# Patient Record
Sex: Male | Born: 1995
Health system: Southern US, Community
[De-identification: ages and names within clinical notes are randomized; demographics above are authoritative.]

## PROBLEM LIST (undated history)

## (undated) DIAGNOSIS — J45909 Unspecified asthma, uncomplicated: Secondary | ICD-10-CM

## (undated) HISTORY — PX: LYMPH NODE BIOPSY: SHX201

## (undated) HISTORY — PX: KNEE SURGERY: SHX244

---
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
## 2017-01-23 ENCOUNTER — Encounter (HOSPITAL_COMMUNITY): Payer: Self-pay

## 2017-01-23 ENCOUNTER — Emergency Department (HOSPITAL_COMMUNITY)
Admission: EM | Admit: 2017-01-23 | Discharge: 2017-01-23 | Disposition: A | Discharge status: Home / Self Care | Payer: BC Managed Care – PPO | Attending: Emergency Medicine | Admitting: Emergency Medicine

## 2017-01-23 DIAGNOSIS — J45909 Unspecified asthma, uncomplicated: Secondary | ICD-10-CM | POA: Diagnosis not present

## 2017-01-23 DIAGNOSIS — R55 Syncope and collapse: Secondary | ICD-10-CM | POA: Diagnosis present

## 2017-01-23 DIAGNOSIS — R001 Bradycardia, unspecified: Secondary | ICD-10-CM | POA: Insufficient documentation

## 2017-01-23 LAB — CBC WITH DIFFERENTIAL/PLATELET
BASOS ABS: 0 10*3/uL (ref 0.0–0.1)
BASOS PCT: 1 %
EOS PCT: 2 %
Eosinophils Absolute: 0.1 10*3/uL (ref 0.0–0.7)
HEMATOCRIT: 39.4 % (ref 39.0–52.0)
Hemoglobin: 13.2 g/dL (ref 13.0–17.0)
LYMPHS PCT: 24 %
Lymphs Abs: 1.3 10*3/uL (ref 0.7–4.0)
MCH: 28.9 pg (ref 26.0–34.0)
MCHC: 33.5 g/dL (ref 30.0–36.0)
MCV: 86.4 fL (ref 78.0–100.0)
MONO ABS: 0.4 10*3/uL (ref 0.1–1.0)
MONOS PCT: 8 %
NEUTROS ABS: 3.5 10*3/uL (ref 1.7–7.7)
Neutrophils Relative %: 65 %
PLATELETS: 243 10*3/uL (ref 150–400)
RBC: 4.56 MIL/uL (ref 4.22–5.81)
RDW: 13.6 % (ref 11.5–15.5)
WBC: 5.4 10*3/uL (ref 4.0–10.5)

## 2017-01-23 LAB — BASIC METABOLIC PANEL
Anion gap: 6 (ref 5–15)
BUN: 10 mg/dL (ref 6–20)
CALCIUM: 9.2 mg/dL (ref 8.9–10.3)
CO2: 27 mmol/L (ref 22–32)
Chloride: 105 mmol/L (ref 101–111)
Creatinine, Ser: 0.96 mg/dL (ref 0.61–1.24)
GFR calc Af Amer: >60 mL/min (ref >60)
GLUCOSE: 102 mg/dL — AB (ref 65–99)
Potassium: 4.2 mmol/L (ref 3.5–5.1)
Sodium: 138 mmol/L (ref 135–145)

## 2017-01-23 MED ORDER — AMMONIA AROMATIC IN INHA
RESPIRATORY_TRACT | Status: AC
  Filled 2017-01-23: qty 10

## 2017-01-23 NOTE — ED Notes (Signed)
Pt has a hematoma on his forehead from the fall

## 2017-01-23 NOTE — ED Provider Notes (Signed)
[facility]-EMERGENCY DEPT Provider Note: Johnny Dell, MD, FACEP  CSN: [id number] MRN: [id number] ARRIVAL: 01/23/17 at 0115 ROOM: WA20/[facility]20   CHIEF COMPLAINT  Syncope   HISTORY OF PRESENT ILLNESS  Johnny Keller is a 21 y.o. male. He was at the police station just prior to arrival having turn himself in for some outstanding warrants. He felt the need to move his bowels and as he was walking to the bathroom he had a syncopal episode. He bumped his left forehead on a wall and has a superficial abrasion there. He was brought to the ED where he is noted to be in sinus bradycardia, consistent with previous sinus bradycardia seen on EKG in 2015. He has had no further syncopal episodes and has been sleeping peacefully in the ED. He denies pain. He denies lightheadedness at the present time.   Past Medical History:  Diagnosis Date  . Asthma     Past Surgical History:  Procedure Laterality Date  . LYMPH NODE BIOPSY      History reviewed. No pertinent family history.  Social History  Substance Use Topics  . Smoking status: Never Smoker  . Smokeless tobacco: Never Used  . Alcohol use No    Prior to Admission medications   Not on File    Allergies Patient has no known allergies.   REVIEW OF SYSTEMS  Negative except as noted here or in the History of Present Illness.   PHYSICAL EXAMINATION  Initial Vital Signs Blood pressure (!) 103/55, pulse (!) 46, temperature (P) 97.6 F (36.4 C), temperature source (P) Oral, resp. rate 18, SpO2 98 %.  Examination General: Well-developed, well-nourished male in no acute distress; appearance consistent with age of record HENT: normocephalic; atraumatic Eyes: pupils equal, round and reactive to light; extraocular muscles intact Neck: supple Heart: regular rate and rhythm; sinus bradycardia on the monitor Lungs: clear to auscultation bilaterally Abdomen: soft; nondistended; nontender; bowel sounds present Extremities: No deformity; full  range of motion; pulses normal Neurologic: Awake, alert and oriented; motor function intact in all extremities and symmetric; no facial droop Skin: Warm and dry Psychiatric: Flat affect   RESULTS  Summary of this visit's results, reviewed by myself:   EKG Interpretation  Date/Time:  Wednesday Jan 23 2017 03:41:49 EDT Ventricular Rate:  44 PR Interval:    QRS Duration: 97 QT Interval:  435 QTC Calculation: 373 R Axis:   92 Text Interpretation:  Sinus bradycardia Borderline right axis deviation Abnormal Q suggests anterior infarct ST elev, probable normal early repol pattern No significant change was found Confirmed by Kyston Gonce  MD, Jonny Ruiz ([id number]) on 01/23/2017 4:48:44 AM      Laboratory Studies: Results for orders placed or performed during the hospital encounter of 01/23/17 (from the past 24 hour(s))  CBC with Differential/Platelet     Status: None   Collection Time: 01/23/17  3:25 AM  Result Value Ref Range   WBC 5.4 4.0 - 10.5 K/uL   RBC 4.56 4.22 - 5.81 MIL/uL   Hemoglobin 13.2 13.0 - 17.0 g/dL   HCT 62.1 30.8 - 65.7 %   MCV 86.4 78.0 - 100.0 fL   MCH 28.9 26.0 - 34.0 pg   MCHC 33.5 30.0 - 36.0 g/dL   RDW 84.6 96.2 - 95.2 %   Platelets 243 150 - 400 K/uL   Neutrophils Relative % 65 %   Neutro Abs 3.5 1.7 - 7.7 K/uL   Lymphocytes Relative 24 %   Lymphs Abs 1.3 0.7 - 4.0 K/uL  Monocytes Relative 8 %   Monocytes Absolute 0.4 0.1 - 1.0 K/uL   Eosinophils Relative 2 %   Eosinophils Absolute 0.1 0.0 - 0.7 K/uL   Basophils Relative 1 %   Basophils Absolute 0.0 0.0 - 0.1 K/uL  Basic metabolic panel     Status: Abnormal   Collection Time: 01/23/17  3:25 AM  Result Value Ref Range   Sodium 138 135 - 145 mmol/L   Potassium 4.2 3.5 - 5.1 mmol/L   Chloride 105 101 - 111 mmol/L   CO2 27 22 - 32 mmol/L   Glucose, Bld 102 (H) 65 - 99 mg/dL   BUN 10 6 - 20 mg/dL   Creatinine, Ser 0.27 0.61 - 1.24 mg/dL   Calcium 9.2 8.9 - 25.3 mg/dL   GFR calc non Af Amer >60 >60 mL/min   GFR  calc Af Amer >60 >60 mL/min   Anion gap 6 5 - 15   Imaging Studies: No results found.  ED COURSE  Nursing notes and initial vitals signs, including pulse oximetry, reviewed.  Vitals:   01/23/17 0315 01/23/17 0400 01/23/17 0500 01/23/17 0541  BP: (!) 103/55 (!) 96/45 (!) 98/55 110/65  Pulse: (!) 46 (!) 43 (!) 42 76  Resp: Temp:      TempSrc:      SpO2: 98% 100% 98% 100%   5:43 AM Most recent BP 110/65. Patient has ambulated without difficulty. His heart rate will drop into the high 40s when ambulating but he is not subjectively orthostatic. His heart rate has dropped low while sleeping but as noted on previous EKG from 2015 his heart rate was 43. I suspect his sinus bradycardia is a chronic issue. He admits to being athletic in his younger years.   PROCEDURES    ED DIAGNOSES     ICD-9-CM ICD-10-CM   1. Vasovagal syncope 780.2 R55   2. Chronic sinus bradycardia 427.81 R00.1        Paula Libra, MD 01/23/17 [phone]

## 2017-01-23 NOTE — ED Notes (Signed)
Pt was at the jail after turning himself in on some warrants for assault on a male and destruction of property Pt passed out while being processed and instructed to come to the ED

## 2017-01-23 NOTE — ED Triage Notes (Signed)
Pt states he has passed out before and was never told why he was

## 2017-01-23 NOTE — ED Notes (Signed)
Pt aware that a urine sample is needed but is unable to urinate at this time. 

## 2017-10-16 ENCOUNTER — Emergency Department (HOSPITAL_COMMUNITY)
Admission: EM | Admit: 2017-10-16 | Discharge: 2017-10-16 | Disposition: A | Discharge status: Home / Self Care | Payer: BC Managed Care – PPO | Attending: Emergency Medicine | Admitting: Emergency Medicine

## 2017-10-16 ENCOUNTER — Other Ambulatory Visit: Payer: Self-pay

## 2017-10-16 ENCOUNTER — Encounter (HOSPITAL_COMMUNITY): Payer: Self-pay | Admitting: Emergency Medicine

## 2017-10-16 DIAGNOSIS — Y999 Unspecified external cause status: Secondary | ICD-10-CM | POA: Insufficient documentation

## 2017-10-16 DIAGNOSIS — R55 Syncope and collapse: Secondary | ICD-10-CM | POA: Insufficient documentation

## 2017-10-16 DIAGNOSIS — Y939 Activity, unspecified: Secondary | ICD-10-CM | POA: Insufficient documentation

## 2017-10-16 DIAGNOSIS — Z23 Encounter for immunization: Secondary | ICD-10-CM | POA: Diagnosis not present

## 2017-10-16 DIAGNOSIS — S0181XA Laceration without foreign body of other part of head, initial encounter: Secondary | ICD-10-CM | POA: Diagnosis present

## 2017-10-16 DIAGNOSIS — W1830XA Fall on same level, unspecified, initial encounter: Secondary | ICD-10-CM | POA: Insufficient documentation

## 2017-10-16 DIAGNOSIS — Y929 Unspecified place or not applicable: Secondary | ICD-10-CM | POA: Diagnosis not present

## 2017-10-16 LAB — BASIC METABOLIC PANEL
ANION GAP: 8 (ref 5–15)
BUN: 5 mg/dL — ABNORMAL LOW (ref 6–20)
CALCIUM: 9.2 mg/dL (ref 8.9–10.3)
CO2: 24 mmol/L (ref 22–32)
Chloride: 108 mmol/L (ref 101–111)
Creatinine, Ser: 0.89 mg/dL (ref 0.61–1.24)
GFR calc Af Amer: >60 mL/min (ref >60)
Glucose, Bld: 104 mg/dL — ABNORMAL HIGH (ref 65–99)
POTASSIUM: 3.9 mmol/L (ref 3.5–5.1)
SODIUM: 140 mmol/L (ref 135–145)

## 2017-10-16 LAB — CBC
HEMATOCRIT: 40.4 % (ref 39.0–52.0)
HEMOGLOBIN: 13.5 g/dL (ref 13.0–17.0)
MCH: 28.5 pg (ref 26.0–34.0)
MCHC: 33.4 g/dL (ref 30.0–36.0)
MCV: 85.4 fL (ref 78.0–100.0)
Platelets: 187 10*3/uL (ref 150–400)
RBC: 4.73 MIL/uL (ref 4.22–5.81)
RDW: 13.5 % (ref 11.5–15.5)
WBC: 5.9 10*3/uL (ref 4.0–10.5)

## 2017-10-16 MED ORDER — TETANUS-DIPHTH-ACELL PERTUSSIS 5-2.5-18.5 LF-MCG/0.5 IM SUSP
0.5000 mL | Freq: Once | INTRAMUSCULAR | Status: AC
  Administered 2017-10-16: 0.5 mL via INTRAMUSCULAR
  Filled 2017-10-16: qty 0.5

## 2017-10-16 NOTE — ED Provider Notes (Signed)
[facility] EMERGENCY DEPARTMENT Provider Note   CSN: [id number] Arrival date & time: 10/16/17  1478     History   Chief Complaint Chief Complaint  Patient presents with  . Loss of Consciousness    HPI Johnny Keller is a 22 y.o. male.  The history is provided by the patient and medical records. No language interpreter was used.  Loss of Consciousness   Pertinent negatives include dizziness, headaches and weakness.   Johnny Keller is a 22 y.o. male  with a PMH of asthma who presents to the Emergency Department complaining of "needing stitches". He states that he passed out yesterday around 4pm and struck his chin on the ground. He will give very little history of what happened. He states that this has happened to him before and he was seen in ED and they told him everything was fine. He states that he didn't eat anything all day so he was dehydrated and just passed out. He states he is only here to get stiches and leave and doesn't want to talk about his syncopal episode any further. Unsure of tetanus status.   Past Medical History:  Diagnosis Date  . Asthma     There are no active problems to display for this patient.   Past Surgical History:  Procedure Laterality Date  . LYMPH NODE BIOPSY         Home Medications    Prior to Admission medications   Not on File    Family History History reviewed. No pertinent family history.  Social History Social History   Tobacco Use  . Smoking status: Never Smoker  . Smokeless tobacco: Never Used  Substance Use Topics  . Alcohol use: No  . Drug use: Yes    Types: Marijuana     Allergies   Patient has no known allergies.   Review of Systems Review of Systems  Cardiovascular: Positive for syncope.  Skin: Positive for wound.  Neurological: Positive for syncope. Negative for dizziness, weakness and headaches.  All other systems reviewed and are negative.    Physical Exam Updated Vital  Signs BP 114/69 (BP Location: Right Arm)   Pulse 63   Temp 98.2 F (36.8 C) (Oral)   Resp 18   SpO2 100%   Physical Exam  Constitutional: He is oriented to person, place, and time. He appears well-developed and well-nourished. No distress.  HENT:  Head: Normocephalic and atraumatic.  Cardiovascular: Normal rate, regular rhythm and normal heart sounds.  No murmur heard. Pulmonary/Chest: Effort normal and breath sounds normal. No respiratory distress.  Abdominal: Soft. He exhibits no distension. There is no tenderness.  Musculoskeletal: Normal range of motion.  Neurological: He is alert and oriented to person, place, and time.  Skin: Skin is warm and dry.  See image below of chin. 2.5 cm laceration.  Nursing note and vitals reviewed.      ED Treatments / Results  Labs (all labs ordered are listed, but only abnormal results are displayed) Labs Reviewed  BASIC METABOLIC PANEL - Abnormal; Notable for the following components:      Result Value   Glucose, Bld 104 (*)    BUN 5 (*)    All other components within normal limits  CBC    EKG  EKG Interpretation  Date/Time:  Wednesday October 16 2017 09:43:21 EST Ventricular Rate:  65 PR Interval:  152 QRS Duration: 90 QT Interval:  380 QTC Calculation: 395 R Axis:   88 Text Interpretation:  Normal sinus rhythm with sinus arrhythmia Right atrial enlargement Early repolarization Borderline ECG No significant change since last tracing Confirmed by Margarita Grizzleay, Danielle [phone]([id number]) on 10/16/2017 10:17:19 AM       Radiology No results found.  Procedures .Marland Kitchen.Laceration Repair Date/Time: 10/16/2017 10:34 AM Performed by: Spurgeon Gancarz, Chase PicketJaime Pilcher, PA-C Authorized by: Amaris Delafuente, Chase PicketJaime Pilcher, PA-C   Consent:    Consent obtained:  Verbal   Consent given by:  Patient   Risks discussed:  Poor cosmetic result and poor wound healing Anesthesia (see MAR for exact dosages):    Anesthesia method:  None Laceration details:    Location:  Face   Face  location:  Chin   Length (cm):  2 Repair type:    Repair type:  Simple Exploration:    Hemostasis achieved with:  Direct pressure Treatment:    Area cleansed with:  Hibiclens   Amount of cleaning:  Standard   Irrigation solution:  Sterile saline Skin repair:    Repair method:  Steri-Strips   Number of Steri-Strips:  1 Approximation:    Approximation:  Close   Vermilion border: well-aligned   Post-procedure details:    Dressing:  Open (no dressing)   (including critical care time)  Medications Ordered in ED Medications  Tdap (BOOSTRIX) injection 0.5 mL (0.5 mLs Intramuscular Given 10/16/17 1042)     Initial Impression / Assessment and Plan / ED Course  I have reviewed the triage vital signs and the nursing notes.  Pertinent labs & imaging results that were available during my care of the patient were reviewed by me and considered in my medical decision making (see chart for details).    Johnny Keller is a 22 y.o. male who presents to ED for evaluation of laceration to the chin which occurred at 4 PM yesterday. He reports that he "passed out" because he did not have anything to eat and was dehydrated. He refused cardiac monitoring and informed me that he had been seen for these episodes in the past and everything was fine. Patient refused cardiac monitoring. I discussed reason for cardiac monitoring for evaluation of syncope with NT in the room. Patient still refused. He states that he is not in the ER for syncopal episode and he is "only here to get my stitches and go home". Labs and EKG reassuring. He does have laceration to the chin. Unfortunately, this occurred ~ 18 hours ago. I feel the risk of suturing is greater than benefits. Wound thoroughly cleaned and irrigated in ED by me. Steri-strip applied and wound dressed. Signs of infection / reasons to return to ER discussed. Strongly encouraged patient to follow up with PCP for further discussion on syncope. All questions answered.    Patient discussed with Dr. Rosalia Hammersay who agrees with treatment plan.   Final Clinical Impressions(s) / ED Diagnoses   Final diagnoses:  Chin laceration, initial encounter    ED Discharge Orders    None       Bannon Giammarco, Chase PicketJaime Pilcher, PA-C 10/16/17 1044    Margarita Grizzleay, Danielle, MD 10/16/17 1712

## 2017-10-16 NOTE — ED Notes (Signed)
Pt refused to be on heart monitor. And refused to be in gown. Pt stated that his heart is fine and don't want to be on monitor.

## 2017-10-16 NOTE — ED Triage Notes (Signed)
Pt to for evaluation of syncopal episode yesterday. Abrasion to chin noted. VSS. Pt poor historian. Not providing much information.

## 2017-10-16 NOTE — Discharge Instructions (Signed)
Please keep wound clean and dry. Monitor for signs of infection including worsening redness, swelling or drainage from the site.  Return to ER for signs of infection, new or worsening symptoms, any additional concerns. I would also encourage you to follow up with your primary doctor for further discussion on your syncopal episodes (passing out).

## 2018-02-20 ENCOUNTER — Encounter (HOSPITAL_COMMUNITY): Payer: Self-pay | Admitting: Emergency Medicine

## 2018-02-20 ENCOUNTER — Ambulatory Visit (HOSPITAL_COMMUNITY)
Admission: EM | Admit: 2018-02-20 | Discharge: 2018-02-20 | Disposition: A | Discharge status: Home / Self Care | Payer: BC Managed Care – PPO | Attending: Family Medicine | Admitting: Family Medicine

## 2018-02-20 DIAGNOSIS — Z202 Contact with and (suspected) exposure to infections with a predominantly sexual mode of transmission: Secondary | ICD-10-CM | POA: Insufficient documentation

## 2018-02-20 DIAGNOSIS — Z113 Encounter for screening for infections with a predominantly sexual mode of transmission: Secondary | ICD-10-CM | POA: Diagnosis not present

## 2018-02-20 DIAGNOSIS — Z79899 Other long term (current) drug therapy: Secondary | ICD-10-CM | POA: Insufficient documentation

## 2018-02-20 MED ORDER — METRONIDAZOLE 500 MG PO TABS: 2000.0000 mg | ORAL_TABLET | Freq: Once | ORAL | 0 refills | Status: AC

## 2018-02-20 NOTE — Discharge Instructions (Signed)
You were treated empirically for trichomonas. Start flagyl as directed. Blood draw and cytology sent, you will be contacted with any positive results that requires further treatment. Refrain from sexual activity and alcohol use for the next 7 days. Monitor for any worsening of symptoms, fever, abdominal pain, nausea, vomiting, testicle pain/swelling, lesions/ulcer/sores on your penis, to follow up for reevaluation.

## 2018-02-20 NOTE — ED Notes (Signed)
Urine specimen obtained and in lab 

## 2018-02-20 NOTE — ED Triage Notes (Signed)
Pt states he was involved sexually with someone positive for trich. Pt needs std testing, denies symptoms.

## 2018-02-20 NOTE — ED Provider Notes (Signed)
[facility]    CSN: [id number] Arrival date & time: 02/20/18  1551     History   Chief Complaint Chief Complaint  Patient presents with  . Exposure to STD    HPI Johnny Keller is a 22 y.o. male.   22 year old male comes in for  STD testing.  States partner was tested positive for trichomonas.  Patient is asymptomatic.  Denies fever, chills, night sweats.  Denies abdominal pain, nausea, vomiting.  Denies urinary symptoms such as dysuria, frequency, hematuria.  Denies penile discharge, penile lesion, testicular pain, testicular swelling.  He is sexually active with one male partner, no condom use.     Past Medical History:  Diagnosis Date  . Asthma     There are no active problems to display for this patient.   Past Surgical History:  Procedure Laterality Date  . LYMPH NODE BIOPSY         Home Medications    Prior to Admission medications   Medication Sig Start Date End Date Taking? Authorizing Provider  QUEtiapine Fumarate (SEROQUEL PO) Take by mouth.   Yes [provider]  metroNIDAZOLE (FLAGYL) 500 MG tablet Take 4 tablets (2,000 mg total) by mouth once for 1 dose. 02/20/18 02/20/18  Belinda Fisher, PA-C    Family History No family history on file.  Social History Social History   Tobacco Use  . Smoking status: Never Smoker  . Smokeless tobacco: Never Used  Substance Use Topics  . Alcohol use: No  . Drug use: Yes    Types: Marijuana     Allergies   Patient has no known allergies.   Review of Systems Review of Systems  Reason unable to perform ROS: See HPI as above.     Physical Exam Triage Vital Signs ED Triage Vitals [02/20/18 1612]  Enc Vitals Group     BP (!) 155/54     Pulse Rate (!) 51     Resp 14     Temp 98.5 F (36.9 C)     Temp Source Oral     SpO2 100 %     Weight      Height      Head Circumference      Peak Flow      Pain Score      Pain Loc      Pain Edu?      Excl. in GC?    No data  found.  Updated Vital Signs BP (!) 155/54 (BP Location: Right Arm)   Pulse (!) 51   Temp 98.5 F (36.9 C) (Oral)   Resp 14   SpO2 100%   Visual Acuity Right Eye Distance:   Left Eye Distance:   Bilateral Distance:    Right Eye Near:   Left Eye Near:    Bilateral Near:     Physical Exam  Constitutional: He is oriented to person, place, and time. He appears well-developed and well-nourished. No distress.  HENT:  Head: Normocephalic and atraumatic.  Eyes: Pupils are equal, round, and reactive to light. Conjunctivae are normal.  Neurological: He is alert and oriented to person, place, and time.     UC Treatments / Results  Labs (all labs ordered are listed, but only abnormal results are displayed) Labs Reviewed  HIV ANTIBODY (ROUTINE TESTING)  URINE CYTOLOGY ANCILLARY ONLY    EKG None  Radiology No results found.  Procedures Procedures (including critical care time)  Medications Ordered in UC Medications - No data  to display  Initial Impression / Assessment and Plan / UC Course  I have reviewed the triage vital signs and the nursing notes.  Pertinent labs & imaging results that were available during my care of the patient were reviewed by me and considered in my medical decision making (see chart for details).    Patient was treated empirically for trich. Flagyl as directed. Cytology sent, patient will be contacted with any positive results that require additional treatment. Patient to refrain from sexual activity for the next 7 days. Return precautions given.   Final Clinical Impressions(s) / UC Diagnoses   Final diagnoses:  Exposure to STD    ED Prescriptions    Medication Sig Dispense Auth. Provider   metroNIDAZOLE (FLAGYL) 500 MG tablet Take 4 tablets (2,000 mg total) by mouth once for 1 dose. 4 tablet Threasa Alpha, [place] 02/20/18 1801

## 2018-02-21 ENCOUNTER — Telehealth (HOSPITAL_COMMUNITY): Payer: Self-pay

## 2018-02-21 LAB — URINE CYTOLOGY ANCILLARY ONLY
CHLAMYDIA, DNA PROBE: NEGATIVE
Neisseria Gonorrhea: NEGATIVE
Trichomonas: POSITIVE — AB

## 2018-02-21 LAB — HIV ANTIBODY (ROUTINE TESTING W REFLEX): HIV Screen 4th Generation wRfx: NONREACTIVE

## 2018-02-21 NOTE — Telephone Encounter (Signed)
Trichomonas is positive. Rx metronidazole was given at the urgent care visit. Need to educate patient to please refrain from sexual intercourse for 7 days to give the medicine time to work. Sexual partners need to be notified and tested/treated. Condoms may reduce risk of reinfection. Patient not available at this time. Encouraged family to have patient call back.

## 2018-03-24 ENCOUNTER — Telehealth (HOSPITAL_COMMUNITY): Payer: Self-pay

## 2018-03-24 NOTE — Telephone Encounter (Signed)
Pt returned call and is aware of test results.

## 2018-08-29 ENCOUNTER — Encounter (HOSPITAL_BASED_OUTPATIENT_CLINIC_OR_DEPARTMENT_OTHER): Payer: Self-pay | Admitting: Emergency Medicine

## 2018-08-29 ENCOUNTER — Other Ambulatory Visit: Payer: Self-pay

## 2018-08-29 ENCOUNTER — Emergency Department (HOSPITAL_BASED_OUTPATIENT_CLINIC_OR_DEPARTMENT_OTHER)
Admission: EM | Admit: 2018-08-29 | Discharge: 2018-08-29 | Disposition: A | Discharge status: Home / Self Care | Payer: BC Managed Care – PPO | Attending: Emergency Medicine | Admitting: Emergency Medicine

## 2018-08-29 ENCOUNTER — Emergency Department (HOSPITAL_BASED_OUTPATIENT_CLINIC_OR_DEPARTMENT_OTHER): Payer: BC Managed Care – PPO

## 2018-08-29 DIAGNOSIS — S62302A Unspecified fracture of third metacarpal bone, right hand, initial encounter for closed fracture: Secondary | ICD-10-CM | POA: Insufficient documentation

## 2018-08-29 DIAGNOSIS — J45909 Unspecified asthma, uncomplicated: Secondary | ICD-10-CM | POA: Insufficient documentation

## 2018-08-29 DIAGNOSIS — F17210 Nicotine dependence, cigarettes, uncomplicated: Secondary | ICD-10-CM | POA: Insufficient documentation

## 2018-08-29 DIAGNOSIS — W2209XA Striking against other stationary object, initial encounter: Secondary | ICD-10-CM | POA: Insufficient documentation

## 2018-08-29 DIAGNOSIS — Y929 Unspecified place or not applicable: Secondary | ICD-10-CM | POA: Insufficient documentation

## 2018-08-29 DIAGNOSIS — Y999 Unspecified external cause status: Secondary | ICD-10-CM | POA: Insufficient documentation

## 2018-08-29 DIAGNOSIS — Y939 Activity, unspecified: Secondary | ICD-10-CM | POA: Diagnosis not present

## 2018-08-29 DIAGNOSIS — S6991XA Unspecified injury of right wrist, hand and finger(s), initial encounter: Secondary | ICD-10-CM | POA: Diagnosis present

## 2018-08-29 MED ORDER — TRAMADOL HCL 50 MG PO TABS: 50.0000 mg | ORAL_TABLET | Freq: Four times a day (QID) | ORAL | 0 refills | Status: DC | PRN

## 2018-08-29 MED FILL — traMADol HCL 50 MG TABS: 50 | 3 days supply | Qty: 15 | Fill #0

## 2018-08-29 NOTE — ED Provider Notes (Signed)
[facility] EMERGENCY DEPARTMENT Provider Note   CSN: [id number] Arrival date & time: 08/29/18  [id number]     History   Chief Complaint Chief Complaint  Patient presents with  . Hand Injury    HPI Johnny Keller is a 22 y.o. male.  Patient is a 22 year old male with no significant past medical history.  He presents with complaints of right hand pain.  He punched a wall last night while he was angry.  He has swelling over the knuckles and pain with movement and range of motion.  The history is provided by the patient.  Hand Injury   The incident occurred yesterday. The incident occurred at home. Injury mechanism: Punched a wall. The pain is present in the right hand. The pain is moderate. The pain has been constant since the incident. The symptoms are aggravated by movement and palpation.    Past Medical History:  Diagnosis Date  . Asthma     There are no active problems to display for this patient.   Past Surgical History:  Procedure Laterality Date  . LYMPH NODE BIOPSY          Home Medications    Prior to Admission medications   Medication Sig Start Date End Date Taking? Authorizing Provider  QUEtiapine Fumarate (SEROQUEL PO) Take by mouth.    [provider]    Family History History reviewed. No pertinent family history.  Social History Social History   Tobacco Use  . Smoking status: Current Every Day Smoker    Packs/day: 1.00    Types: Cigarettes  . Smokeless tobacco: Never Used  Substance Use Topics  . Alcohol use: No  . Drug use: Yes    Types: Marijuana     Allergies   Patient has no known allergies.   Review of Systems Review of Systems  All other systems reviewed and are negative.    Physical Exam Updated Vital Signs BP (!) 105/99 (BP Location: Left Arm)   Pulse 62   Temp 98.3 F (36.8 C) (Oral)   Resp 16   Ht 5\' 11"  (1.803 m)   Wt 63.5 kg   SpO2 98%   BMI 19.53 kg/m   Physical Exam  Constitutional: He  is oriented to person, place, and time. He appears well-developed and well-nourished. No distress.  HENT:  Head: Normocephalic and atraumatic.  Neck: Normal range of motion. Neck supple.  Pulmonary/Chest: Effort normal.  Musculoskeletal:  There is swelling and tenderness over the distal third carpal.  He has pain with range of motion.  Motor and sensation are intact all fingers and capillary refill is brisk.  Neurological: He is alert and oriented to person, place, and time.  Skin: He is not diaphoretic.  Nursing note and vitals reviewed.    ED Treatments / Results  Labs (all labs ordered are listed, but only abnormal results are displayed) Labs Reviewed - No data to display  EKG None  Radiology No results found.  Procedures Procedures (including critical care time)  Medications Ordered in ED Medications - No data to display   Initial Impression / Assessment and Plan / ED Course  I have reviewed the triage vital signs and the nursing notes.  Pertinent labs & imaging results that were available during my care of the patient were reviewed by me and considered in my medical decision making (see chart for details).  X-rays show a fracture of the distal third metacarpal.  It is nondisplaced.  He will be placed  in a splint and advised to follow-up with hand surgery in the next week.  Final Clinical Impressions(s) / ED Diagnoses   Final diagnoses:  None    ED Discharge Orders    None       Geoffery Lyons, MD 08/29/18 [phone]

## 2018-08-29 NOTE — Discharge Instructions (Addendum)
Ibuprofen 600 mg every 6 hours as needed for pain.  Tramadol as prescribed as needed for pain not relieved with ibuprofen.  Wear splint until followed up by orthopedics.  You are to schedule an appointment with the hand surgeon within the next week.  The contact information for Dr. Bing ReeUzma has been provided in this discharge summary for you to call and make these arrangements.

## 2018-08-29 NOTE — ED Triage Notes (Signed)
Reports punched a wall with right hand.  C/o pain to entire right hand.

## 2018-12-24 ENCOUNTER — Emergency Department (HOSPITAL_COMMUNITY): Payer: BC Managed Care – PPO

## 2018-12-24 ENCOUNTER — Encounter (HOSPITAL_COMMUNITY): Payer: Self-pay

## 2018-12-24 ENCOUNTER — Emergency Department (HOSPITAL_COMMUNITY)
Admission: EM | Admit: 2018-12-24 | Discharge: 2018-12-24 | Disposition: A | Discharge status: Home / Self Care | Payer: BC Managed Care – PPO | Attending: Emergency Medicine | Admitting: Emergency Medicine

## 2018-12-24 ENCOUNTER — Other Ambulatory Visit: Payer: Self-pay

## 2018-12-24 DIAGNOSIS — X509XXA Other and unspecified overexertion or strenuous movements or postures, initial encounter: Secondary | ICD-10-CM | POA: Diagnosis not present

## 2018-12-24 DIAGNOSIS — S83005A Unspecified dislocation of left patella, initial encounter: Secondary | ICD-10-CM

## 2018-12-24 DIAGNOSIS — Y939 Activity, unspecified: Secondary | ICD-10-CM | POA: Insufficient documentation

## 2018-12-24 DIAGNOSIS — Y999 Unspecified external cause status: Secondary | ICD-10-CM | POA: Diagnosis not present

## 2018-12-24 DIAGNOSIS — Y929 Unspecified place or not applicable: Secondary | ICD-10-CM | POA: Diagnosis not present

## 2018-12-24 DIAGNOSIS — F17210 Nicotine dependence, cigarettes, uncomplicated: Secondary | ICD-10-CM | POA: Diagnosis not present

## 2018-12-24 DIAGNOSIS — J45909 Unspecified asthma, uncomplicated: Secondary | ICD-10-CM | POA: Insufficient documentation

## 2018-12-24 DIAGNOSIS — S80912A Unspecified superficial injury of left knee, initial encounter: Secondary | ICD-10-CM | POA: Diagnosis present

## 2018-12-24 MED ORDER — ETOMIDATE 2 MG/ML IV SOLN
INTRAVENOUS | Status: AC | PRN
  Administered 2018-12-24: 10 mg via INTRAVENOUS

## 2018-12-24 MED ORDER — ETOMIDATE 2 MG/ML IV SOLN
0.1500 mg/kg | Freq: Once | INTRAVENOUS | Status: AC
  Administered 2018-12-24: 23:00:00 9.52 mg via INTRAVENOUS
  Filled 2018-12-24: qty 10

## 2018-12-24 MED ORDER — KETOROLAC TROMETHAMINE 30 MG/ML IJ SOLN
30.0000 mg | Freq: Once | INTRAMUSCULAR | Status: AC
  Administered 2018-12-24: 30 mg via INTRAVENOUS
  Filled 2018-12-24: qty 1

## 2018-12-24 MED ORDER — HYDROMORPHONE HCL 1 MG/ML IJ SOLN
1.0000 mg | Freq: Once | INTRAMUSCULAR | Status: AC
  Administered 2018-12-24: 1 mg via INTRAVENOUS
  Filled 2018-12-24: qty 1

## 2018-12-24 MED ORDER — LORAZEPAM 2 MG/ML IJ SOLN
1.0000 mg | Freq: Once | INTRAMUSCULAR | Status: AC
  Administered 2018-12-24: 1 mg via INTRAVENOUS
  Filled 2018-12-24: qty 1

## 2018-12-24 MED ORDER — ONDANSETRON HCL 4 MG/2ML IJ SOLN
4.0000 mg | Freq: Once | INTRAMUSCULAR | Status: AC
  Administered 2018-12-24: 4 mg via INTRAVENOUS
  Filled 2018-12-24: qty 2

## 2018-12-24 NOTE — ED Notes (Signed)
Patient transported to X-ray 

## 2018-12-24 NOTE — ED Notes (Addendum)
Pts sweatpants and long sleeve shirt cut off, per pt request.  Pt still in undershirt and basketball shorts.  Wallet, 2 cell phones, loose cash and change from sweatpants all placed in pt belonging bag and left at bedside.

## 2018-12-24 NOTE — ED Notes (Signed)
Pt not allowing this RN to assist with clothing removal at this time, stating that he is in too much pain.

## 2018-12-24 NOTE — ED Notes (Signed)
Pt refuses wheelchair and will use his crutches to leave

## 2018-12-24 NOTE — ED Triage Notes (Signed)
Pt BIBA from home.  Pt was reaching into car when "knee started hurting, was able to lower himself to the ground."  Knee is in pillow splint on arrival.    EMS gave total of 150 mcg Fentanyl, last dose at 1648.

## 2018-12-24 NOTE — ED Provider Notes (Signed)
[facility]-EMERGENCY DEPT Provider Note   CSN: [id number] Arrival date & time: 12/24/18  1654    History   Chief Complaint Chief Complaint  Patient presents with  . Knee Injury    left    HPI Johnny Keller is a 23 y.o. male.     HPI   Patient reports twisting his knee and his left knee "came out of socket."  He was walking to his car carrying something when it happened.  He states his left patella is "always on the side."  Last problem with it was 2 years ago.  He is never had surgery for it.  He resents by EMS for evaluation.  He was treated with analgesia during transport.  He is still in severe pain and has not been able to be dressed by nursing, secondary to his pain.  There are no other known modifying factors.  Past Medical History:  Diagnosis Date  . Asthma     There are no active problems to display for this patient.   Past Surgical History:  Procedure Laterality Date  . LYMPH NODE BIOPSY          Home Medications    Prior to Admission medications   Medication Sig Start Date End Date Taking? Authorizing Provider  traMADol (ULTRAM) 50 MG tablet Take 1 tablet (50 mg total) by mouth every 6 (six) hours as needed. Patient not taking: Reported on 12/24/2018 08/29/18   Geoffery Lyons, MD    Family History No family history on file.  Social History Social History   Tobacco Use  . Smoking status: Current Every Day Smoker    Packs/day: 1.00    Types: Cigarettes  . Smokeless tobacco: Never Used  Substance Use Topics  . Alcohol use: No  . Drug use: Yes    Types: Marijuana     Allergies   Patient has no known allergies.   Review of Systems Review of Systems  All other systems reviewed and are negative.    Physical Exam Updated Vital Signs BP 116/70   Pulse (!) 47   Resp 15   Ht 5' 11.5" (1.816 m)   Wt 63.5 kg   SpO2 100%   BMI 19.25 kg/m   Physical Exam Vitals signs and nursing note reviewed.  Constitutional:     General: He is in acute distress.     Appearance: He is well-developed. He is not ill-appearing, toxic-appearing or diaphoretic.  HENT:     Head: Normocephalic and atraumatic.     Right Ear: External ear normal.     Left Ear: External ear normal.  Eyes:     Conjunctiva/sclera: Conjunctivae normal.     Pupils: Pupils are equal, round, and reactive to light.  Neck:     Musculoskeletal: Normal range of motion and neck supple.     Trachea: Phonation normal.  Cardiovascular:     Rate and Rhythm: Normal rate.  Pulmonary:     Effort: Pulmonary effort is normal.  Musculoskeletal:     Comments: Fully close lower extremities.  Pillow/splint taped to left leg, across knee joint.  Left patella, is in a lateral location.  Neurovascular intact distally in the left foot.  Skin:    General: Skin is warm and dry.  Neurological:     Mental Status: He is alert and oriented to person, place, and time.     Cranial Nerves: No cranial nerve deficit.     Sensory: No sensory deficit.  Motor: No abnormal muscle tone.     Coordination: Coordination normal.  Psychiatric:        Mood and Affect: Mood normal.        Behavior: Behavior normal.        Thought Content: Thought content normal.        Judgment: Judgment normal.      ED Treatments / Results  Labs (all labs ordered are listed, but only abnormal results are displayed) Labs Reviewed - No data to display  EKG None  Radiology Mr Knee Left Wo Contrast  Result Date: 12/24/2018 CLINICAL DATA:  Acute knee pain. EXAM: MRI OF THE LEFT KNEE WITHOUT CONTRAST TECHNIQUE: Multiplanar, multisequence MR imaging of the knee was performed. No intravenous contrast was administered. COMPARISON:  Left knee x-rays from same day. FINDINGS: MENISCI Medial meniscus:  Intact. Lateral meniscus:  Intact. LIGAMENTS Cruciates:  Intact ACL and PCL. Collaterals: Medial collateral ligament is intact. Lateral collateral ligament complex is intact. CARTILAGE  Patellofemoral:  Normal. Medial:  Normal. Lateral:  Normal. Joint: Small hemarthrosis in the lateral suprapatellar joint space. Normal Hoffa's fat. No plical thickening. Popliteal Fossa: No significant Baker cyst. Intact popliteus tendon. Extensor Mechanism: Intact quadriceps tendon and patellar tendon. High-grade partial tear of the MPFL at its patellar attachment. Bones: Lateral dislocation of the patella with small contusions of the medial patella and peripheral lateral femoral condyle. No fracture. No suspicious bone lesion. Small cortical desmoid at the posteromedial aspect of the distal femoral metaphysis. Other: None. IMPRESSION: 1. Lateral dislocation of patella with small contusions of the medial patella and peripheral lateral femoral condyle. No fracture. 2. High-grade partial tear of the MPFL at its patellar attachment. 3. Small hemarthrosis. Electronically Signed   By: Obie Dredge M.D.   On: 12/24/2018 21:08   Dg Knee Complete 4 Views Left  Result Date: 12/24/2018 CLINICAL DATA:  LEFT knee popped out of socket.  Pain. EXAM: LEFT KNEE - COMPLETE 4+ VIEW COMPARISON:  None. FINDINGS: There is rotatory subluxation of the knee, with the medial condyle anteriorly subluxed and the patella laterally subluxed. There is some approximation of the lateral femoral condyle to the tibial plateau. No fracture is seen. There is no significant joint effusion. IMPRESSION: Rotatory subluxation of the knee, as described. No visible fracture. Electronically Signed   By: Elsie Stain M.D.   On: 12/24/2018 18:35    Procedures .Sedation Date/Time: 12/24/2018 10:50 PM Performed by: Mancel Bale, MD Authorized by: Mancel Bale, MD   Consent:    Consent obtained:  Written   Consent given by:  Patient   Risks discussed:  Inadequate sedation, nausea, vomiting and respiratory compromise necessitating ventilatory assistance and intubation   Alternatives discussed:  Analgesia without sedation Indications:     Procedure performed:  Dislocation reduction   Procedure necessitating sedation performed by:  Physician performing sedation Pre-sedation assessment:    Time since last food or drink:  4 hour   ASA classification: class 1 - normal, healthy patient     Mouth opening:  3 or more finger widths   Mallampati score:  III - soft palate, base of uvula visible   Pre-sedation assessments completed and reviewed: airway patency, cardiovascular function, hydration status, mental status and respiratory function     Pre-sedation assessments completed and reviewed: nausea/vomiting not reviewed and pain level not reviewed     Pre-sedation assessment completed:  12/24/2018 8:30 PM Immediate pre-procedure details:    Reassessment: Patient reassessed immediately prior to procedure  Reviewed: vital signs     Verified: bag valve mask available, emergency equipment available, intubation equipment available, IV patency confirmed and oxygen available   Procedure details (see MAR for exact dosages):    Preoxygenation:  Nasal cannula   Sedation:  Etomidate   Analgesia:  Hydromorphone   Intra-procedure monitoring:  Blood pressure monitoring, cardiac monitor, continuous capnometry, continuous pulse oximetry, frequent LOC assessments and frequent vital sign checks   Intra-procedure events: none     Total Provider sedation time (minutes):  5 Post-procedure details:    Post-sedation assessment completed:  12/24/2018 10:54 PM   Attendance: Constant attendance by certified staff until patient recovered     Recovery: Patient returned to pre-procedure baseline     Post-sedation assessments completed and reviewed: airway patency, cardiovascular function, mental status and respiratory function     Post-sedation assessments completed and reviewed: nausea/vomiting not reviewed and pain level not reviewed     Patient is stable for discharge or admission: yes     Patient tolerance:  Tolerated well, no immediate complications  Reduction of dislocation Date/Time: 12/24/2018 10:55 PM Performed by: Mancel Bale, MD Authorized by: Mancel Bale, MD  Consent: Verbal consent obtained. Written consent obtained. Risks and benefits: risks, benefits and alternatives were discussed Consent given by: patient Patient understanding: patient states understanding of the procedure being performed Patient consent: the patient's understanding of the procedure matches consent given Procedure consent: procedure consent matches procedure scheduled Relevant documents: relevant documents present and verified Test results: test results available and properly labeled Imaging studies: imaging studies available Patient identity confirmed: verbally with patient and arm band Time out: Immediately prior to procedure a "time out" was called to verify the correct patient, procedure, equipment, support staff and site/side marked as required. Local anesthesia used: no  Anesthesia: Local anesthesia used: no  Sedation: Patient sedated: yes Sedatives: etomidate Analgesia: hydromorphone Sedation start date/time: 12/24/2018 10:35 PM Sedation end date/time: 12/24/2018 10:45 PM Vitals: Vital signs were monitored during sedation.  Patient tolerance: Patient tolerated the procedure well with no immediate complications    (including critical care time)  Medications Ordered in ED Medications  HYDROmorphone (DILAUDID) injection 1 mg (1 mg Intravenous Given 12/24/18 1731)  LORazepam (ATIVAN) injection 1 mg (1 mg Intravenous Given 12/24/18 1730)  ondansetron (ZOFRAN) injection 4 mg (4 mg Intravenous Given 12/24/18 1730)  HYDROmorphone (DILAUDID) injection 1 mg (1 mg Intravenous Given 12/24/18 1840)  ketorolac (TORADOL) 30 MG/ML injection 30 mg (30 mg Intravenous Given 12/24/18 2054)  etomidate (AMIDATE) injection 9.52 mg (9.52 mg Intravenous Given 12/24/18 2238)  etomidate (AMIDATE) injection (10 mg Intravenous Given 12/24/18 2238)     Initial Impression /  Assessment and Plan / ED Course  I have reviewed the triage vital signs and the nursing notes.  Pertinent labs & imaging results that were available during my care of the patient were reviewed by me and considered in my medical decision making (see chart for details).  Clinical Course as of Dec 23 2304  Wed Dec 24, 2018  1610 Patient states that this time his pain is "tolerable."   [EW]  2256 Rotational deformity of knee, possibly positional related with possible patellar dislocation.  Images reviewed by me.  DG Knee Complete 4 Views Left [EW]  2257 MR KNEE LEFT WO CONTRAST [EW]    Clinical Course User Index [EW] Mancel Bale, MD        Patient Vitals for the past 24 hrs:  BP Pulse Resp SpO2 Height Weight  12/24/18  2255 116/70 - 15 - - -  12/24/18 2250 121/85 - 15 - - -  12/24/18 2245 126/83 - 13 - - -  12/24/18 2240 124/79 - 18 - - -  12/24/18 2237 127/72 - 16 - - -  12/24/18 2235 134/71 (!) 47 17 100 % - -  12/24/18 2235 134/71 - 13 - - -  12/24/18 2222 133/84 - - - - -  12/24/18 2115 - 81 - 98 % - -  12/24/18 2100 (!) 138/92 81 17 98 % - -  12/24/18 1930 118/79 (!) 57 (!) 23 99 % - -  12/24/18 1929 - (!) 56 - 99 % - -  12/24/18 1900 114/63 (!) 59 14 100 % - -  12/24/18 1830 130/69 67 20 98 % - -  12/24/18 1810 127/78 66 (!) 24 98 % - -  12/24/18 1730 128/69 - (!) 21 - - -  12/24/18 1709 - - - - 5' 11.5" (1.816 m) 63.5 kg  12/24/18 1705 - - - 100 % - -    10:56 PM Reevaluation with update and discussion. After initial assessment and treatment, an updated evaluation reveals he is ambulating wearing the knee immobilizer without pain.  Findings discussed and questions answered. Mancel Bale   Medical Decision Making: Recurrent patellar dislocation.  Significant tear of the medial patellofemoral ligament, likely contributing to instability of the patella.  No significant fracture, bone contusions patella and femur condyle present.  No indication for hospitalization or  further ED treatment at this time.  CRITICAL CARE-no Performed by: Mancel Bale  Nursing Notes Reviewed/ Care Coordinated Applicable Imaging Reviewed Interpretation of Laboratory Data incorporated into ED treatment  The patient appears reasonably screened and/or stabilized for discharge and I doubt any other medical condition or other [facility] requiring further screening, evaluation, or treatment in the ED at this time prior to discharge.  Plan: Home Medications-ibuprofen 3 or 4 times a day; Home Treatments-knee immobilizer, when up, cryotherapy; return here if the recommended treatment, does not improve the symptoms; Recommended follow up-orthopedic follow-up soon as possible for further evaluation and treatment.   Final Clinical Impressions(s) / ED Diagnoses   Final diagnoses:  Dislocation of left patella, initial encounter    ED Discharge Orders    None       Mancel Bale, MD 12/24/18 2308

## 2018-12-24 NOTE — Progress Notes (Signed)
This Clinical research associate at bedside for conscious sedation procedure, which pt tolerated well.

## 2018-12-24 NOTE — ED Notes (Signed)
Patient transported to MRI 

## 2018-12-24 NOTE — ED Notes (Addendum)
Pt very aggitated stating no one has been in his room for 3 hours. Pt was informed that staff has in fact been in his room. Pt states he has not had medication for pain, pt was informed he had been given pain medication, as well as this RN giving him toradol for pain.   Attempted to get vital signs, pt removed pulse ox

## 2018-12-24 NOTE — ED Notes (Signed)
Pt returned from MRI °

## 2018-12-24 NOTE — ED Notes (Signed)
Pt reports feeling his knee pop out of socket.  Pt stated this has happened once prior in the past, appx 2 years ago.

## 2018-12-24 NOTE — ED Notes (Signed)
Pt is ambulatory In room without assistance

## 2018-12-24 NOTE — Discharge Instructions (Signed)
Your patella was dislocated today, and has been replaced.  The MRI showed a contusion of the patella and the femoral condyle bones.  Also there was a high-grade tear of the medial patellofemoral ligament.  This is the cause of the patella dislocation, and may require surgical repair.  Please wear the knee immobilizer, whenever you are up and walking, until you follow-up with the orthopedic doctor for further care and treatment.  Also use ice on the sore area 3 or 4 times a day, to help with the discomfort.  Use ice for 2 to 3 days after that heat can help.  For pain ibuprofen, 400 mg, 3 or 4 times a day can help.

## 2019-02-13 ENCOUNTER — Other Ambulatory Visit: Payer: Self-pay

## 2019-02-13 ENCOUNTER — Emergency Department (HOSPITAL_COMMUNITY)
Admission: EM | Admit: 2019-02-13 | Discharge: 2019-02-13 | Disposition: A | Discharge status: Home / Self Care | Payer: BC Managed Care – PPO | Attending: Emergency Medicine | Admitting: Emergency Medicine

## 2019-02-13 ENCOUNTER — Emergency Department (HOSPITAL_COMMUNITY): Payer: BC Managed Care – PPO

## 2019-02-13 DIAGNOSIS — J45909 Unspecified asthma, uncomplicated: Secondary | ICD-10-CM | POA: Insufficient documentation

## 2019-02-13 DIAGNOSIS — Y999 Unspecified external cause status: Secondary | ICD-10-CM | POA: Diagnosis not present

## 2019-02-13 DIAGNOSIS — S8992XA Unspecified injury of left lower leg, initial encounter: Secondary | ICD-10-CM | POA: Diagnosis present

## 2019-02-13 DIAGNOSIS — F17210 Nicotine dependence, cigarettes, uncomplicated: Secondary | ICD-10-CM | POA: Diagnosis not present

## 2019-02-13 DIAGNOSIS — Y929 Unspecified place or not applicable: Secondary | ICD-10-CM | POA: Diagnosis not present

## 2019-02-13 DIAGNOSIS — S83005A Unspecified dislocation of left patella, initial encounter: Secondary | ICD-10-CM

## 2019-02-13 DIAGNOSIS — Y939 Activity, unspecified: Secondary | ICD-10-CM | POA: Insufficient documentation

## 2019-02-13 DIAGNOSIS — W51XXXA Accidental striking against or bumped into by another person, initial encounter: Secondary | ICD-10-CM | POA: Diagnosis not present

## 2019-02-13 MED ORDER — LORAZEPAM 2 MG/ML IJ SOLN
1.0000 mg | Freq: Once | INTRAMUSCULAR | Status: AC
  Administered 2019-02-13: 1 mg via INTRAVENOUS
  Filled 2019-02-13: qty 1

## 2019-02-13 MED ORDER — FENTANYL CITRATE (PF) 100 MCG/2ML IJ SOLN
50.0000 ug | Freq: Once | INTRAMUSCULAR | Status: AC
  Administered 2019-02-13: 03:00:00 50 ug via INTRAVENOUS
  Filled 2019-02-13: qty 2

## 2019-02-13 MED ORDER — IBUPROFEN 400 MG PO TABS: 400.0000 mg | ORAL_TABLET | Freq: Four times a day (QID) | ORAL | 0 refills | Status: DC | PRN

## 2019-02-13 MED ORDER — HYDROCODONE-ACETAMINOPHEN 5-325 MG PO TABS: 1.0000 | ORAL_TABLET | Freq: Four times a day (QID) | ORAL | 0 refills | Status: DC | PRN

## 2019-02-13 MED ORDER — PROPOFOL 10 MG/ML IV BOLUS
40.0000 mg | Freq: Once | INTRAVENOUS | Status: DC
  Filled 2019-02-13: qty 20

## 2019-02-13 MED ORDER — PROPOFOL 10 MG/ML IV BOLUS
INTRAVENOUS | Status: AC | PRN
  Administered 2019-02-13 (×2): 40 mg via INTRAVENOUS

## 2019-02-13 NOTE — Discharge Instructions (Signed)
Please followup with orthopedic doctor listed above
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
## 2019-02-13 NOTE — Progress Notes (Signed)
Assisted with conscious sedation.

## 2019-10-22 ENCOUNTER — Ambulatory Visit: Payer: BC Managed Care – PPO | Attending: Internal Medicine

## 2019-10-22 DIAGNOSIS — Z20822 Contact with and (suspected) exposure to covid-19: Secondary | ICD-10-CM

## 2019-10-23 LAB — NOVEL CORONAVIRUS, NAA: SARS-CoV-2, NAA: NOT DETECTED

## 2019-12-22 IMAGING — DX PORTABLE LEFT KNEE - 1-2 VIEW
3 series · 3 of 3 positions shown · non-contrast
Comparison: 12/24/2018.

CLINICAL DATA: 22-year-old male with left knee dislocation.

EXAM:
PORTABLE LEFT KNEE - 1-2 VIEW

[knee ap]
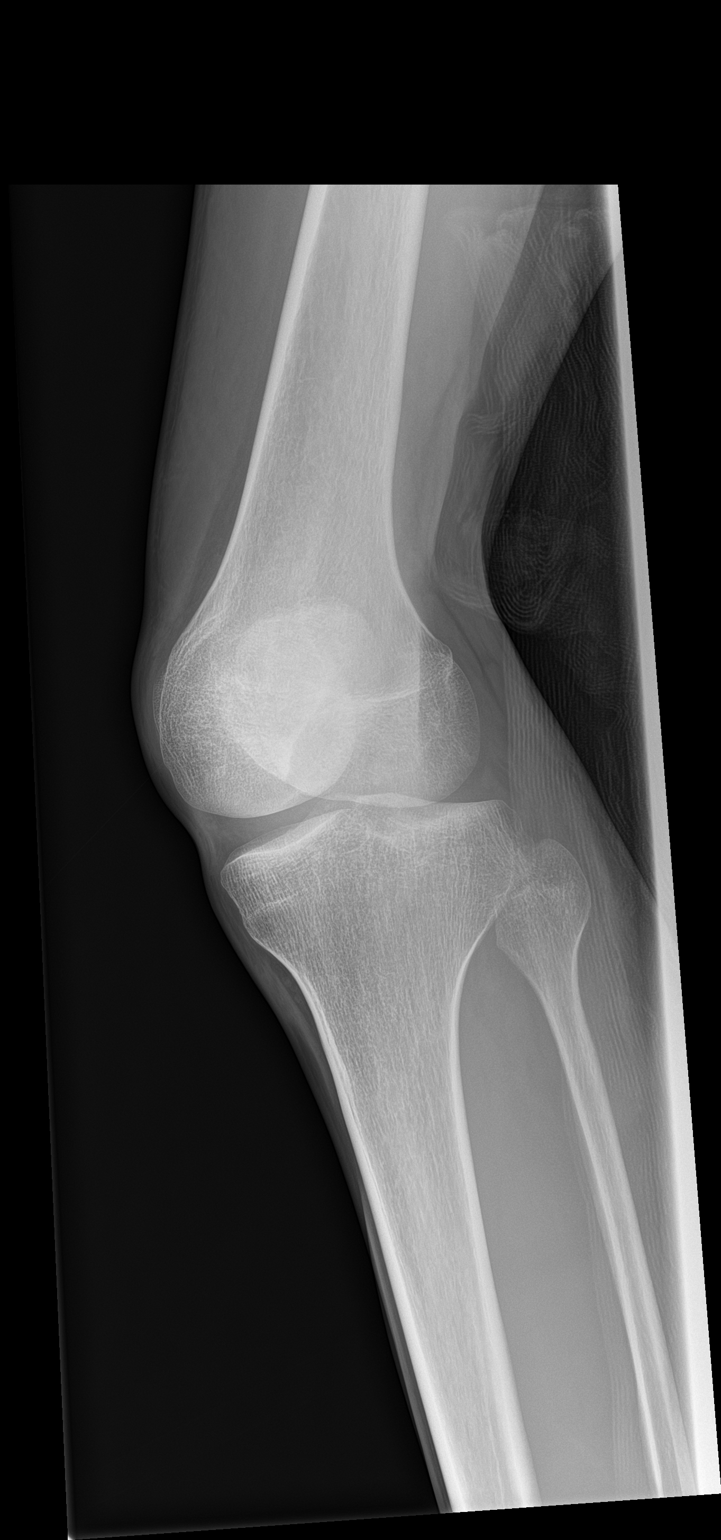

[knee lat (1 of 2)]
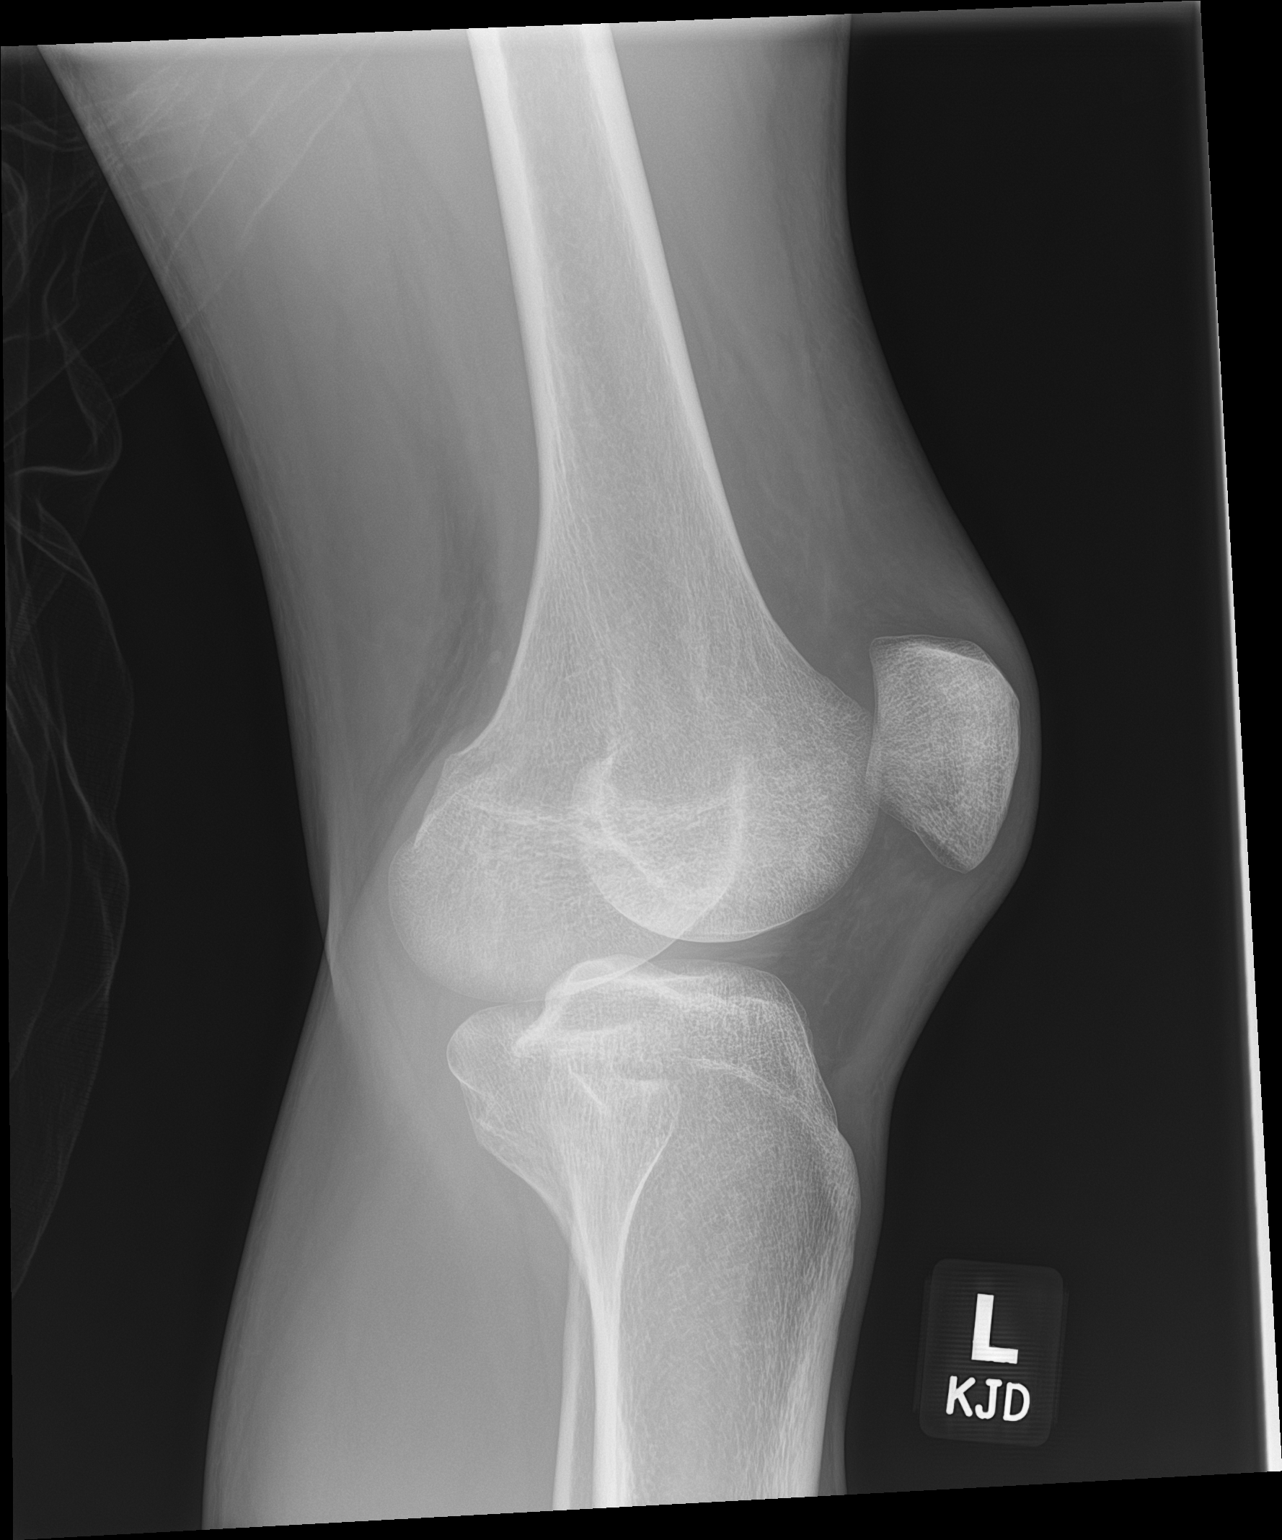

[knee lat (2 of 2)]
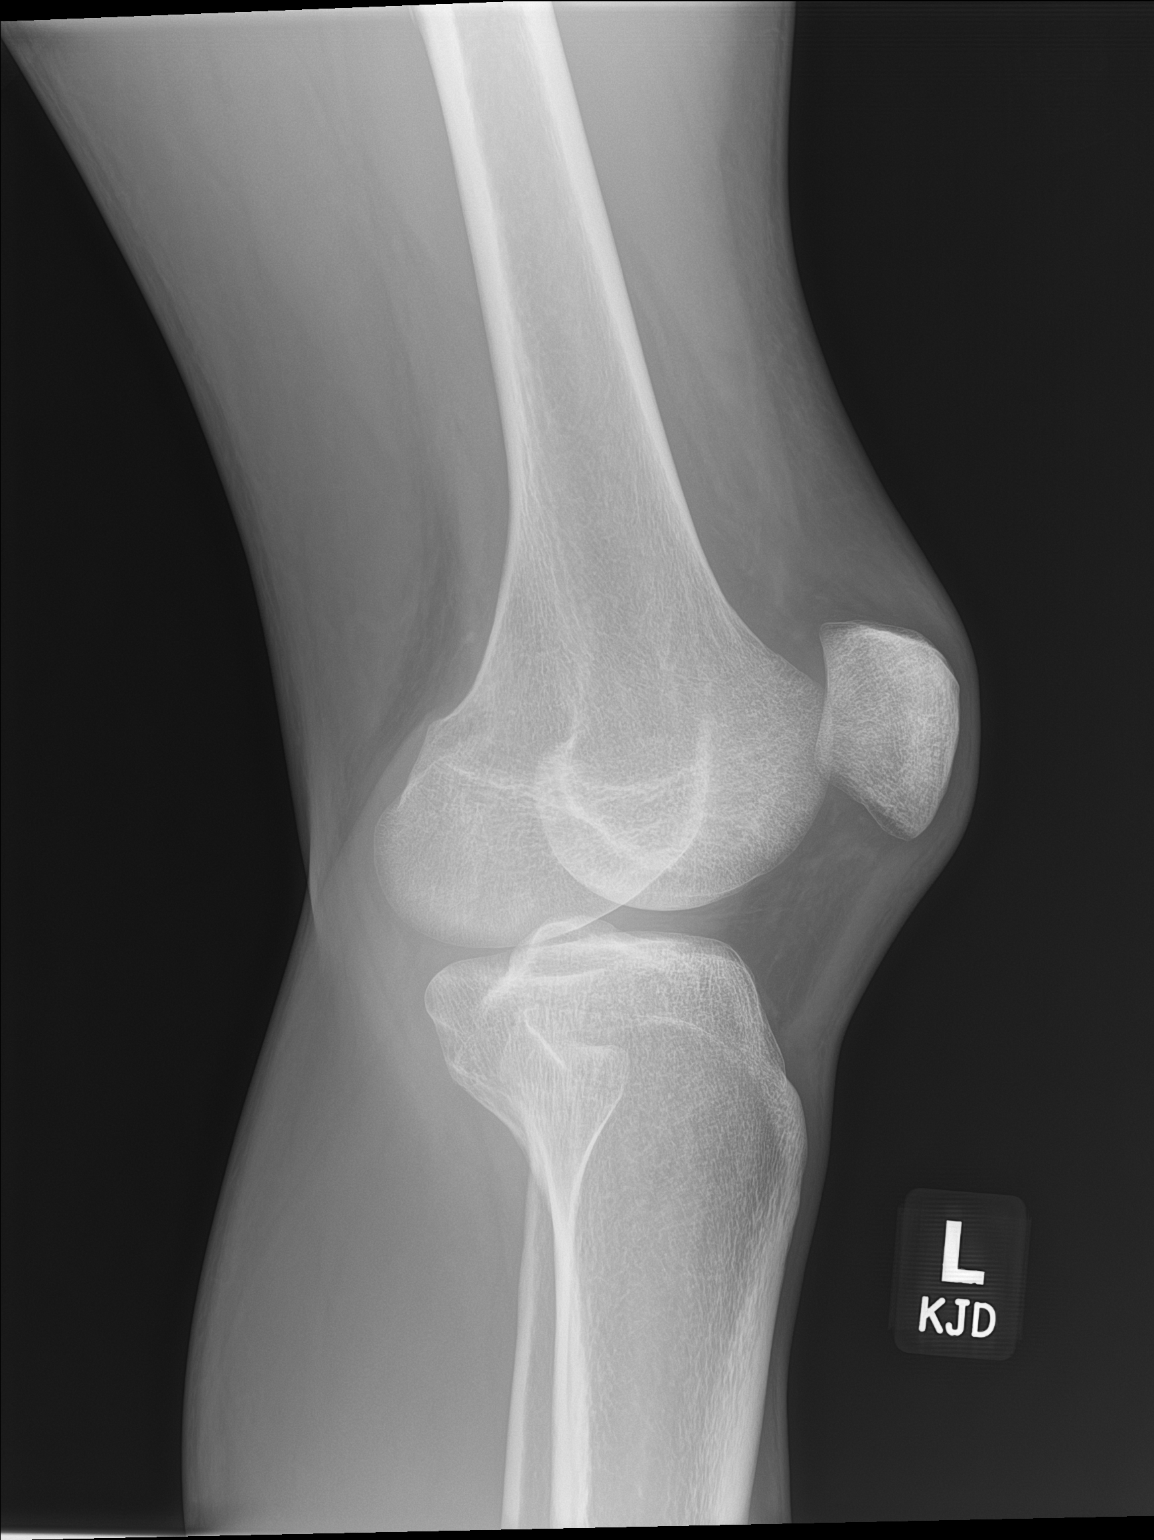

[3 of 3 positions shown; findings below may reference images not displayed]

FINDINGS: No joint effusion identified. The lateral patellar dislocation
demonstrated by MRI previously was largely occult on the x-rays at
that time, and the patellar alignment today appears similar. Joint
spaces are preserved. No acute fracture identified.
IMPRESSION: Difficult to exclude recurrent lateral patellar dislocation as was
demonstrated in [place]. No fracture or joint effusion identified.

## 2020-11-01 ENCOUNTER — Ambulatory Visit: Payer: Self-pay | Admitting: Nurse Practitioner

## 2020-11-02 ENCOUNTER — Ambulatory Visit: Payer: Self-pay | Admitting: Nurse Practitioner

## 2021-11-29 ENCOUNTER — Ambulatory Visit (HOSPITAL_COMMUNITY)
Admission: EM | Admit: 2021-11-29 | Discharge: 2021-11-29 | Disposition: A | Discharge status: Home / Self Care | Payer: BC Managed Care – PPO | Attending: Family Medicine | Admitting: Family Medicine

## 2021-11-29 ENCOUNTER — Other Ambulatory Visit: Payer: Self-pay

## 2021-11-29 ENCOUNTER — Encounter (HOSPITAL_COMMUNITY): Payer: Self-pay | Admitting: Emergency Medicine

## 2021-11-29 DIAGNOSIS — Z202 Contact with and (suspected) exposure to infections with a predominantly sexual mode of transmission: Secondary | ICD-10-CM | POA: Insufficient documentation

## 2021-11-29 DIAGNOSIS — Z113 Encounter for screening for infections with a predominantly sexual mode of transmission: Secondary | ICD-10-CM | POA: Insufficient documentation

## 2021-11-29 MED ORDER — METRONIDAZOLE 500 MG PO TABS: 2000.0000 mg | ORAL_TABLET | Freq: Once | ORAL | 0 refills | Status: AC

## 2021-11-29 NOTE — ED Provider Notes (Signed)
?[facility] ? ? ? ?CSN: [id number] ?Arrival date & time: 11/29/21  1430 ? ? ?  ? ?History   ?Chief Complaint ?Chief Complaint  ?Patient presents with  ? SEXUALLY TRANSMITTED DISEASE  ? ? ?HPI ?Johnny Keller is a 26 y.o. male.  ? ?Patient is here for STD exposure.  He states his GF was positive for trich yesterday.  No symptoms noted today.  No penile  d/c or painful urination.  ?No abd pain, back pain or fevers.  ?He declines blood work today.  ? ?Past Medical History:  ?Diagnosis Date  ? Asthma   ? ? ?There are no problems to display for this patient. ? ? ?Past Surgical History:  ?Procedure Laterality Date  ? LYMPH NODE BIOPSY    ? ? ? ? ? ?Home Medications   ? ?Prior to Admission medications   ?Not on File  ? ? ?Family History ?Family History  ?Problem Relation Age of Onset  ? Healthy Mother   ? Healthy Father   ? ? ?Social History ?Social History  ? ?Tobacco Use  ? Smoking status: Every Day  ?  Packs/day: 1.00  ?  Types: Cigarettes  ? Smokeless tobacco: Never  ?Vaping Use  ? Vaping Use: Some days  ?Substance Use Topics  ? Alcohol use: Yes  ? Drug use: Yes  ?  Types: Marijuana  ? ? ? ?Allergies   ?Patient has no known allergies. ? ? ?Review of Systems ?Review of Systems  ?Constitutional: Negative.   ?HENT: Negative.    ?Respiratory: Negative.    ?Cardiovascular: Negative.   ?Gastrointestinal: Negative.   ? ? ?Physical Exam ?Triage Vital Signs ?ED Triage Vitals  ?Enc Vitals Group  ?   BP 11/29/21 1522 111/68  ?   Pulse Rate 11/29/21 1522 (!) 50  ?   Resp 11/29/21 1522 18  ?   Temp 11/29/21 1522 97.9 ?F (36.6 ?C)  ?   Temp Source 11/29/21 1522 Oral  ?   SpO2 11/29/21 1522 97 %  ?   Weight --   ?   Height --   ?   Head Circumference --   ?   Peak Flow --   ?   Pain Score 11/29/21 1520 0  ?   Pain Loc --   ?   Pain Edu? --   ?   Excl. in GC? --   ? ?No data found. ? ?Updated Vital Signs ?BP 111/68 (BP Location: Left Arm)   Pulse (!) 50   Temp 97.9 ?F (36.6 ?C) (Oral)   Resp 18   SpO2 97%  ? ?Visual  Acuity ?Right Eye Distance:   ?Left Eye Distance:   ?Bilateral Distance:   ? ?Right Eye Near:   ?Left Eye Near:    ?Bilateral Near:    ? ?Physical Exam ?Constitutional:   ?   Appearance: Normal appearance.  ?Pulmonary:  ?   Effort: Pulmonary effort is normal.  ?Neurological:  ?   General: No focal deficit present.  ?   Mental Status: He is alert and oriented to person, place, and time.  ?Psychiatric:     ?   Behavior: Behavior normal.  ? ? ? ?UC Treatments / Results  ?Labs ?(all labs ordered are listed, but only abnormal results are displayed) ?Labs Reviewed  ?CYTOLOGY, (ORAL, ANAL, URETHRAL) ANCILLARY ONLY  ? ? ?EKG ? ? ?Radiology ?No results found. ? ?Procedures ?Procedures (including critical care time) ? ?Medications Ordered in UC ?Medications - No  data to display ? ?Initial Impression / Assessment and Plan / UC Course  ?I have reviewed the triage vital signs and the nursing notes. ? ?Pertinent labs & imaging results that were available during my care of the patient were reviewed by me and considered in my medical decision making (see chart for details). ? ?  ?Final Clinical Impressions(s) / UC Diagnoses  ? ?Final diagnoses:  ?Screening examination for STD (sexually transmitted disease)  ?Exposure to STD  ? ? ? ?Discharge Instructions   ? ?  ?You were seen today for exposure to STD.  I have sent out flagyl to treat trichomonas, take 4 pills all at once.  Avoid intercourse for a week.  ?The genital swab will be resulted tomorrow.  If there is anything else that we need to treat we will call you to prescribe treatment.  ? ? ? ?ED Prescriptions   ? ? Medication Sig Dispense Auth. Provider  ? metroNIDAZOLE (FLAGYL) 500 MG tablet Take 4 tablets (2,000 mg total) by mouth once for 1 dose. 4 tablet Jannifer Franklin, MD  ? ?  ? ?PDMP not reviewed this encounter. ?  ?Jannifer Franklin, MD ?11/29/21 1540 ? ?

## 2021-11-29 NOTE — Discharge Instructions (Addendum)
You were seen today for exposure to STD.  I have sent out flagyl to treat trichomonas, take 4 pills all at once.  Avoid intercourse for a week.  ?The genital swab will be resulted tomorrow.  If there is anything else that we need to treat we will call you to prescribe treatment.  ?

## 2021-11-29 NOTE — ED Triage Notes (Signed)
Patient denies symptoms of std.  Patient's partner has tested positive for trich ?

## 2021-11-30 LAB — CYTOLOGY, (ORAL, ANAL, URETHRAL) ANCILLARY ONLY
Chlamydia: NEGATIVE
Comment: NEGATIVE
Comment: NEGATIVE
Comment: NORMAL
Neisseria Gonorrhea: NEGATIVE
Trichomonas: POSITIVE — AB

## 2023-02-28 ENCOUNTER — Ambulatory Visit: Payer: 59 | Admitting: Family Medicine

## 2023-02-28 ENCOUNTER — Encounter: Payer: Self-pay | Admitting: Family Medicine

## 2023-02-28 VITALS — BP 102/66 | HR 75 | Temp 97.6°F | Ht 71.5 in | Wt 132.0 lb

## 2023-02-28 DIAGNOSIS — Z9189 Other specified personal risk factors, not elsewhere classified: Secondary | ICD-10-CM | POA: Diagnosis not present

## 2023-02-28 DIAGNOSIS — M25561 Pain in right knee: Secondary | ICD-10-CM

## 2023-02-28 DIAGNOSIS — Z8709 Personal history of other diseases of the respiratory system: Secondary | ICD-10-CM | POA: Diagnosis not present

## 2023-02-28 DIAGNOSIS — G8929 Other chronic pain: Secondary | ICD-10-CM

## 2023-02-28 DIAGNOSIS — Z7689 Persons encountering health services in other specified circumstances: Secondary | ICD-10-CM

## 2023-02-28 NOTE — Progress Notes (Signed)
New Patient Office Visit  Subjective    Patient ID: Johnny Keller, male    DOB: [birth year]  Age: 27 y.o. MRN: [id number]  CC:  Chief Complaint  Patient presents with   Establish Care    STD testing    HPI Johnny Keller presents to establish care No recent PCP.  He would like testing for STDs due to recent unprotected sex.  Denies having any symptoms.  [facility] for knee surgery 2 years ago.  He is still experiencing some pain and has difficulty bending his knee all the way.  He has not followed up with them.  No outpatient encounter medications on file as of 02/28/2023.   No facility-administered encounter medications on file as of 02/28/2023.    Past Medical History:  Diagnosis Date   Asthma     Past Surgical History:  Procedure Laterality Date   LYMPH NODE BIOPSY      Family History  Problem Relation Age of Onset   Healthy Mother    Healthy Father     Social History   Socioeconomic History   Marital status: Single    Spouse name: Not on file   Number of children: Not on file   Years of education: Not on file   Highest education level: Not on file  Occupational History   Not on file  Tobacco Use   Smoking status: Every Day    Packs/day: 1    Types: Cigarettes   Smokeless tobacco: Never  Vaping Use   Vaping Use: Some days  Substance and Sexual Activity   Alcohol use: Yes   Drug use: Yes    Types: Marijuana   Sexual activity: Yes    Birth control/protection: Condom  Other Topics Concern   Not on file  Social History Narrative   Not on file   Social Determinants of Health   Financial Resource Strain: Not on file  Food Insecurity: Not on file  Transportation Needs: Not on file  Physical Activity: Not on file  Stress: Not on file  Social Connections: Not on file  Intimate Partner Violence: Not on file    Review of Systems  Constitutional:  Negative for chills and fever.  Respiratory:  Negative for shortness of breath.    Cardiovascular:  Negative for chest pain and palpitations.  Gastrointestinal:  Negative for abdominal pain, constipation, diarrhea, nausea and vomiting.  Genitourinary:  Negative for dysuria, frequency and urgency.        Objective    BP 102/66 (BP Location: Left Arm, Patient Position: Sitting, Cuff Size: Normal)   Pulse 75   Temp 97.6 F (36.4 C) (Temporal)   Ht 5' 11.5" (1.816 m)   Wt 132 lb (59.9 kg)   SpO2 97%   BMI 18.15 kg/m   Physical Exam Constitutional:      General: He is not in acute distress.    Appearance: He is not ill-appearing.  Eyes:     Extraocular Movements: Extraocular movements intact.     Conjunctiva/sclera: Conjunctivae normal.  Cardiovascular:     Rate and Rhythm: Normal rate.  Pulmonary:     Effort: Pulmonary effort is normal.  Musculoskeletal:     Cervical back: Normal range of motion and neck supple.  Skin:    General: Skin is warm and dry.  Neurological:     General: No focal deficit present.     Mental Status: He is alert and oriented to person, place, and time.  Psychiatric:  Mood and Affect: Mood normal.        Behavior: Behavior normal.        Thought Content: Thought content normal.         Assessment & Plan:   Problem List Items Addressed This Visit   None Visit Diagnoses     At risk for sexually transmitted infection due to unprotected sex    -  Primary   Relevant Orders   Hepatitis C antibody   Hepatitis B surface antigen   GC/Chlamydia Probe Amp   HIV Antibody (routine testing w rflx)   RPR   History of asthma       Encounter to establish care       Chronic pain of right knee          He is new to the practice and here to establish care. Discussed using condoms in person safe sex.   Discussed following up with his orthopedist who did his knee surgery.  Recommend over-the-counter Voltaren gel for now. Follow-up pending results for STIs.  Return if symptoms worsen or fail to improve.   Hetty Blend,  NP-C

## 2023-02-28 NOTE — Patient Instructions (Signed)
Please go downstairs for labs and a urine test before you leave.    

## 2023-03-01 LAB — RPR: RPR Ser Ql: NONREACTIVE

## 2023-03-01 LAB — HEPATITIS B SURFACE ANTIGEN: Hepatitis B Surface Ag: NONREACTIVE

## 2023-03-01 LAB — HIV ANTIBODY (ROUTINE TESTING W REFLEX): HIV 1&2 Ab, 4th Generation: NONREACTIVE

## 2023-03-01 LAB — HEPATITIS C ANTIBODY: Hepatitis C Ab: NONREACTIVE

## 2023-03-03 LAB — GC/CHLAMYDIA PROBE AMP
Chlamydia trachomatis, NAA: NEGATIVE
Neisseria Gonorrhoeae by PCR: NEGATIVE

## 2023-06-28 ENCOUNTER — Encounter (HOSPITAL_COMMUNITY): Payer: Self-pay

## 2023-06-28 ENCOUNTER — Emergency Department (HOSPITAL_COMMUNITY)
Admission: EM | Admit: 2023-06-28 | Discharge: 2023-06-28 | Disposition: A | Discharge status: Home / Self Care | Payer: 59 | Attending: Emergency Medicine | Admitting: Emergency Medicine

## 2023-06-28 ENCOUNTER — Emergency Department (HOSPITAL_COMMUNITY): Payer: 59

## 2023-06-28 ENCOUNTER — Other Ambulatory Visit: Payer: Self-pay

## 2023-06-28 DIAGNOSIS — Y9301 Activity, walking, marching and hiking: Secondary | ICD-10-CM | POA: Diagnosis not present

## 2023-06-28 DIAGNOSIS — W228XXA Striking against or struck by other objects, initial encounter: Secondary | ICD-10-CM | POA: Diagnosis not present

## 2023-06-28 DIAGNOSIS — Y92830 Public park as the place of occurrence of the external cause: Secondary | ICD-10-CM | POA: Diagnosis not present

## 2023-06-28 DIAGNOSIS — S99921A Unspecified injury of right foot, initial encounter: Secondary | ICD-10-CM | POA: Diagnosis present

## 2023-06-28 DIAGNOSIS — S92514A Nondisplaced fracture of proximal phalanx of right lesser toe(s), initial encounter for closed fracture: Secondary | ICD-10-CM | POA: Insufficient documentation

## 2023-06-28 MED ORDER — IBUPROFEN 600 MG PO TABS: 600.0000 mg | ORAL_TABLET | Freq: Four times a day (QID) | ORAL | 0 refills | Status: AC | PRN

## 2023-06-28 NOTE — ED Triage Notes (Signed)
Pt complaining of pain in the pinky/adjacent toe that began yesterday after walking around. Pt concerned that it could be broken. No injury to area, able to bear weight but says it is difficult.

## 2023-06-28 NOTE — Progress Notes (Signed)
Orthopedic Tech Progress Note Patient Details:  Johnny Keller 22-Jun-1996 [id number]  Ortho Devices Type of Ortho Device: Buddy tape, Postop shoe/boot Ortho Device/Splint Location: Right foot Ortho Device/Splint Interventions: Application   Post Interventions Patient Tolerated: Well  [name] [name] 06/28/2023, 1:12 PM

## 2023-06-28 NOTE — ED Provider Notes (Signed)
[place] EMERGENCY DEPARTMENT AT [facility] Provider Note   CSN: [id number] Arrival date & time: 06/28/23  1039     History  Chief Complaint  Patient presents with   Toe Pain    Johnny Keller is a 27 y.o. male.  The history is provided by the patient. No language interpreter was used.  Toe Pain     27 year old male presenting with complaint of toe injury.  Patient states yesterday he was walking around the park wearing his slide when he may have struck a metal object and injured his right pinky toe.  He is complaining of throbbing pain about the toe and worried that he may be broken.  He tries taking over-the-counter medication at home for pain with adequate relief.  He does not endorse any numbness and denies any other injury.  Pain is nonradiating.  Home Medications Prior to Admission medications   Not on File      Allergies    Patient has no known allergies.    Review of Systems   Review of Systems  All other systems reviewed and are negative.   Physical Exam Updated Vital Signs BP 127/83 (BP Location: Left Arm)   Pulse 60   Temp 98 F (36.7 C) (Oral)   Resp 18   Ht 6' (1.829 m)   Wt 61.2 kg   SpO2 100%   BMI 18.31 kg/m  Physical Exam Vitals and nursing note reviewed.  Constitutional:      General: He is not in acute distress.    Appearance: He is well-developed.  HENT:     Head: Atraumatic.  Eyes:     Conjunctiva/sclera: Conjunctivae normal.  Musculoskeletal:        General: Signs of injury (Right foot: Tenderness to fifth toe with some swelling and bruising noted but no obvious deformity.  Brisk cap refill.) present.     Cervical back: Neck supple.  Skin:    Findings: No rash.  Neurological:     Mental Status: He is alert.     ED Results / Procedures / Treatments   Labs (all labs ordered are listed, but only abnormal results are displayed) Labs Reviewed - No data to display  EKG None  Radiology DG Foot Complete  Right  Result Date: 06/28/2023 CLINICAL DATA:  Fifth toe injury, diffuse fifth toe pain. EXAM: RIGHT FOOT COMPLETE - 3+ VIEW COMPARISON:  None Available. FINDINGS: Transverse nondisplaced fracture of the fifth toe proximal phalanx. No intra-articular involvement on this foot exam. No additional fracture. Normal alignment and joint spaces. Incidental bone island in the calcaneus. Mild soft tissue edema at the fracture site. IMPRESSION: Nondisplaced fifth toe proximal phalanx fracture. Electronically Signed   By: Narda Rutherford M.D.   On: 06/28/2023 11:39    Procedures Procedures    Medications Ordered in ED Medications - No data to display  ED Course/ Medical Decision Making/ A&P                                 Medical Decision Making Amount and/or Complexity of Data Reviewed Radiology: ordered.  Risk Prescription drug management.   BP 127/83 (BP Location: Left Arm)   Pulse 60   Temp 98 F (36.7 C) (Oral)   Resp 18   Ht 6' (1.829 m)   Wt 61.2 kg   SpO2 100%   BMI 18.31 kg/m   71:55 AM   27 year old male  presenting with complaint of toe injury.  Patient states yesterday he was walking around the park wearing his slide when he may have struck a metal object and injured his right pinky toe.  He is complaining of throbbing pain about the toe and worried that he may be broken.  He tries taking over-the-counter medication at home for pain with adequate relief.  He does not endorse any numbness and denies any other injury.  Pain is nonradiating.  On exam patient is resting comfortably appears to be in no acute discomfort.  He does have point tenderness to his fifth toe on the right foot with some swelling noted.  No obvious deformity.  He is neurovascular intact.  X-ray ordered.  Pain medication offered but patient declined.  DDx: Fracture, dislocation, strain, sprain, contusion  1:19 PM  X-ray of right foot demonstrate a nondisplaced fifth toe proximal phalanx fracture.  This  is a closed injury.  Toe was buddy taped, postop shoe provided, definitive toe fracture care discussed with patient.  He is stable for discharge.  Patient discharged home with ibuprofen as needed for pain.         Final Clinical Impression(s) / ED Diagnoses Final diagnoses:  Closed nondisplaced fracture of proximal phalanx of lesser toe of right foot, initial encounter    Rx / DC Orders ED Discharge Orders          Ordered    ibuprofen (ADVIL) 600 MG tablet  Every 6 hours PRN        06/28/23 1317              Fayrene Helper, PA-C 06/28/23 1320    Arby Barrette, MD 06/28/23 1639

## 2023-06-28 NOTE — Discharge Instructions (Addendum)
You have been evaluated for your symptoms.  You have a broken toe.  Use tape to buddy tape to the next toe for comfort.  Wear postop shoe for support.  Usually it takes about 4 to 6 weeks for your broken bone to heal.
